# Patient Record
Sex: Male | Born: 1970 | Hispanic: No | Marital: Single | Smoking: Never smoker
Health system: Southern US, Community
[De-identification: ages and names within clinical notes are randomized; demographics above are authoritative.]

## PROBLEM LIST (undated history)

## (undated) DIAGNOSIS — I10 Essential (primary) hypertension: Secondary | ICD-10-CM

## (undated) DIAGNOSIS — F429 Obsessive-compulsive disorder, unspecified: Secondary | ICD-10-CM

---

## 1997-12-05 ENCOUNTER — Other Ambulatory Visit: Admission: RE | Admit: 1997-12-05 | Discharge: 1997-12-05 | Payer: Self-pay | Admitting: Family Medicine

## 2004-11-25 ENCOUNTER — Ambulatory Visit: Payer: Self-pay | Admitting: Internal Medicine

## 2004-11-30 ENCOUNTER — Ambulatory Visit: Payer: Self-pay | Admitting: Internal Medicine

## 2004-12-06 ENCOUNTER — Ambulatory Visit: Payer: Self-pay | Admitting: Internal Medicine

## 2015-12-06 ENCOUNTER — Emergency Department (HOSPITAL_COMMUNITY)
Admission: EM | Admit: 2015-12-06 | Discharge: 2015-12-06 | Disposition: A | Discharge status: Home / Self Care | Payer: Managed Care, Other (non HMO) | Attending: Emergency Medicine | Admitting: Emergency Medicine

## 2015-12-06 ENCOUNTER — Encounter (HOSPITAL_COMMUNITY): Payer: Self-pay | Admitting: Emergency Medicine

## 2015-12-06 ENCOUNTER — Emergency Department (HOSPITAL_COMMUNITY): Payer: Managed Care, Other (non HMO)

## 2015-12-06 DIAGNOSIS — R109 Unspecified abdominal pain: Secondary | ICD-10-CM | POA: Diagnosis present

## 2015-12-06 DIAGNOSIS — Z8659 Personal history of other mental and behavioral disorders: Secondary | ICD-10-CM | POA: Insufficient documentation

## 2015-12-06 DIAGNOSIS — N201 Calculus of ureter: Secondary | ICD-10-CM | POA: Insufficient documentation

## 2015-12-06 DIAGNOSIS — I10 Essential (primary) hypertension: Secondary | ICD-10-CM | POA: Diagnosis not present

## 2015-12-06 HISTORY — DX: Essential (primary) hypertension: I10

## 2015-12-06 HISTORY — DX: Obsessive-compulsive disorder, unspecified: F42.9

## 2015-12-06 LAB — COMPREHENSIVE METABOLIC PANEL
ALBUMIN: 3.8 g/dL (ref 3.5–5.0)
ALT: 34 U/L (ref 17–63)
AST: 34 U/L (ref 15–41)
Alkaline Phosphatase: 47 U/L (ref 38–126)
Anion gap: 9 (ref 5–15)
BILIRUBIN TOTAL: 0.6 mg/dL (ref 0.3–1.2)
BUN: 16 mg/dL (ref 6–20)
CHLORIDE: 103 mmol/L (ref 101–111)
CO2: 24 mmol/L (ref 22–32)
CREATININE: 1.86 mg/dL — AB (ref 0.61–1.24)
Calcium: 9.2 mg/dL (ref 8.9–10.3)
GFR calc Af Amer: 48 mL/min — ABNORMAL LOW (ref >60)
GFR, EST NON AFRICAN AMERICAN: 41 mL/min — AB (ref >60)
GLUCOSE: 97 mg/dL (ref 65–99)
Potassium: 4.4 mmol/L (ref 3.5–5.1)
Sodium: 136 mmol/L (ref 135–145)
Total Protein: 6.4 g/dL — ABNORMAL LOW (ref 6.5–8.1)

## 2015-12-06 LAB — CBC WITH DIFFERENTIAL/PLATELET
BASOS ABS: 0 10*3/uL (ref 0.0–0.1)
Basophils Relative: 0 %
EOS PCT: 0 %
Eosinophils Absolute: 0 10*3/uL (ref 0.0–0.7)
HEMATOCRIT: 49.4 % (ref 39.0–52.0)
Hemoglobin: 16.9 g/dL (ref 13.0–17.0)
LYMPHS PCT: 8 %
Lymphs Abs: 0.9 10*3/uL (ref 0.7–4.0)
MCH: 30.6 pg (ref 26.0–34.0)
MCHC: 34.2 g/dL (ref 30.0–36.0)
MCV: 89.3 fL (ref 78.0–100.0)
MONO ABS: 0.5 10*3/uL (ref 0.1–1.0)
MONOS PCT: 5 %
Neutro Abs: 8.7 10*3/uL — ABNORMAL HIGH (ref 1.7–7.7)
Neutrophils Relative %: 87 %
PLATELETS: 274 10*3/uL (ref 150–400)
RBC: 5.53 MIL/uL (ref 4.22–5.81)
RDW: 12.9 % (ref 11.5–15.5)
WBC: 10.1 10*3/uL (ref 4.0–10.5)

## 2015-12-06 LAB — URINE MICROSCOPIC-ADD ON

## 2015-12-06 LAB — URINALYSIS, ROUTINE W REFLEX MICROSCOPIC
Bilirubin Urine: NEGATIVE
GLUCOSE, UA: NEGATIVE mg/dL
Ketones, ur: NEGATIVE mg/dL
LEUKOCYTES UA: NEGATIVE
Nitrite: NEGATIVE
PH: 6.5 (ref 5.0–8.0)
PROTEIN: NEGATIVE mg/dL
Specific Gravity, Urine: 1.02 (ref 1.005–1.030)

## 2015-12-06 LAB — LIPASE, BLOOD: LIPASE: 33 U/L (ref 11–51)

## 2015-12-06 MED ORDER — OXYCODONE-ACETAMINOPHEN 5-325 MG PO TABS: 1.0000 | ORAL_TABLET | ORAL | Status: AC | PRN

## 2015-12-06 MED ORDER — OXYCODONE-ACETAMINOPHEN 5-325 MG PO TABS
1.0000 | ORAL_TABLET | ORAL | Status: DC | PRN
  Administered 2015-12-06: 1 via ORAL

## 2015-12-06 MED ORDER — OXYCODONE-ACETAMINOPHEN 5-325 MG PO TABS
ORAL_TABLET | ORAL | Status: AC
  Filled 2015-12-06: qty 1

## 2015-12-06 MED ORDER — TAMSULOSIN HCL 0.4 MG PO CAPS: 0.4000 mg | ORAL_CAPSULE | Freq: Every day | ORAL | Status: AC

## 2015-12-06 MED ORDER — SODIUM CHLORIDE 0.9 % IV BOLUS (SEPSIS): 1000.0000 mL | Freq: Once | INTRAVENOUS | Status: DC

## 2015-12-06 NOTE — ED Notes (Signed)
Pt. Transported to CT at this time.  

## 2015-12-06 NOTE — ED Provider Notes (Signed)
CSN: [id number]     Arrival date & time 12/06/15  1158 History  By signing my name below, I, Terry Diaz, attest that this documentation has been prepared under the direction and in the presence of [name], PA-C. Electronically Signed: Octavia HeirArianna Diaz, ED Scribe. 12/06/2015. 12:51 PM.    Chief Complaint  Patient presents with  . Flank Pain      The history is provided by the patient. No language interpreter was used.   HPI Comments: Terry Diaz is a 45 y.o. male who presents to the Emergency Department complaining of constant, gradual worsening, moderate, left flank pain that radiates to his LLQ and suprapubic region onset 4 hours ago. Pt reports going to the restroom a little less than normal. He notes he believes he might have a kidney stone due to having a strong family hx of kidney stones. Pt says riding in the car with the smallest bumps in the road caused him increased pain. He notes taking two tylenol to alleviate his pain with no relief. He denies dysuria, urinary frequency, vomiting, or hematuria.  Past Medical History  Diagnosis Date  . Hypertension   . OCD (obsessive compulsive disorder)    History reviewed. No pertinent past surgical history. No family history on file. Social History  Substance Use Topics  . Smoking status: Never Smoker   . Smokeless tobacco: None  . Alcohol Use: Yes     Comment: social    Review of Systems  A complete 10 system review of systems was obtained and all systems are negative except as noted in the HPI and PMH.    Allergies  Review of patient's allergies indicates no known allergies.  Home Medications   Prior to Admission medications   Not on File   Triage vitals: BP 124/76 mmHg  Pulse 63  Temp(Src) 98.4 F (36.9 C) (Oral)  Resp 18  SpO2 98% Physical Exam  Constitutional: He is oriented to person, place, and time. He appears well-developed and well-nourished.  HENT:  Head: Normocephalic.  Eyes: EOM are normal.   Neck: Normal range of motion.  Cardiovascular: Normal rate, regular rhythm and normal heart sounds.   Pulmonary/Chest: Effort normal and breath sounds normal.  Abdominal: Soft. He exhibits no distension. There is no tenderness. There is no rebound and no guarding.  Musculoskeletal: Normal range of motion.  Mild left CVA tenderness  Neurological: He is alert and oriented to person, place, and time.  Psychiatric: He has a normal mood and affect.  Nursing note and vitals reviewed.   ED Course  Procedures  DIAGNOSTIC STUDIES: Oxygen Saturation is 98% on RA, normal by my interpretation.  COORDINATION OF CARE: 12:50 PM Will order CT renal stones. Discussed treatment plan which includes urinalysis with pt at bedside and pt agreed to plan.  Labs Review Labs Reviewed  URINALYSIS, ROUTINE W REFLEX MICROSCOPIC (NOT AT [facility]) - Abnormal; Notable for the following:    Hgb urine dipstick TRACE (*)    All other components within normal limits  COMPREHENSIVE METABOLIC PANEL - Abnormal; Notable for the following:    Creatinine, Ser 1.86 (*)    Total Protein 6.4 (*)    GFR calc non Af Amer 41 (*)    GFR calc Af Amer 48 (*)    All other components within normal limits  CBC WITH DIFFERENTIAL/PLATELET - Abnormal; Notable for the following:    Neutro Abs 8.7 (*)    All other components within normal limits  URINE MICROSCOPIC-ADD ON -  Abnormal; Notable for the following:    Squamous Epithelial / LPF 0-5 (*)    Bacteria, UA RARE (*)    All other components within normal limits  LIPASE, BLOOD    Imaging Review Ct Renal Stone Study  12/06/2015  CLINICAL DATA:  Itchy left flank pain with nausea today. History of hypertension. EXAM: CT ABDOMEN AND PELVIS WITHOUT CONTRAST TECHNIQUE: Multidetector CT imaging of the abdomen and pelvis was performed following the standard protocol without IV contrast. COMPARISON:  None. FINDINGS: Lower chest: Clear lung bases. No significant pleural or pericardial  effusion. Hepatobiliary: The liver appears unremarkable as imaged in the noncontrast state. No evidence of gallstones, gallbladder wall thickening or biliary dilatation. Pancreas: Unremarkable. No pancreatic ductal dilatation or surrounding inflammatory changes. Spleen: Normal in size without focal abnormality. Adrenals/Urinary Tract: Both adrenal glands appear normal. No definite renal calculi. There is mild left-sided hydronephrosis and hydroureter without associated perinephric soft tissue stranding. There is a 1 mm calculus in the distal left ureter, best seen on axial image 69 and coronal image 48. The right ureter and bladder appear unremarkable. Stomach/Bowel: No evidence of bowel wall thickening, distention or surrounding inflammatory change. The appendix appears normal. Vascular/Lymphatic: There are no enlarged abdominal or pelvic lymph nodes. No significant vascular findings on noncontrast imaging. Reproductive: Unremarkable. Other: No evidence of abdominal wall mass or hernia. Musculoskeletal: No acute or significant osseous findings. IMPRESSION: 1. 1 mm partially obstructing distal left ureteral calculus. 2. No other urinary tract calculus. 3. No other acute or significant findings. Electronically Signed   By: [name] M.D.   On: 12/06/2015 13:29   I have personally reviewed and evaluated these images and lab results as part of my medical decision-making.   EKG Interpretation None      MDM   Final diagnoses:  Left ureteral stone    Pt presenting with new onset left flank pain beginning this morning. CT reveals small 1mm partially obstructing left ureteral stone. Labs reveal Cr of 1.86. Unsure of baseline. I spoke with Dr. [name] of urology, appreciate assistance. No intervention at this time but encourage plenty of PO hydration and f/u in clinic this week. On reassessment pt continues to have much improvement in pain. Tolerating PO. VSS and no e/o urosepsis. He does endorse taking  NSAIDS very regularly and states that he remembers his PCP telling him that his kidney function was mildly elevated last time he had screening labs done. I do suspect he has some underlying renal insufficiency. Rx given for pain meds and flomax. Instructed close urology f/u. Reminded pt to have PCP re-check kidney function at his next appointment. ER return precautions given.    I personally performed the services described in this documentation, which was scribed in my presence. The recorded information has been reviewed and is accurate.   Carlene Coria, PA-C 12/06/15 1517  Pricilla Loveless, MD 12/06/15 [phone]

## 2015-12-06 NOTE — ED Notes (Signed)
Pt reports left flank pain starting this morning at 800. Pt alert x4.

## 2015-12-06 NOTE — Discharge Instructions (Signed)
Kidney Stones °Kidney stones (urolithiasis) are deposits that form inside your kidneys. The intense pain is caused by the stone moving through the urinary tract. When the stone moves, the ureter goes into spasm around the stone. The stone is usually passed in the urine.  °CAUSES  °· A disorder that makes certain neck glands produce too much parathyroid hormone (primary hyperparathyroidism). °· A buildup of uric acid crystals, similar to gout in your joints. °· Narrowing (stricture) of the ureter. °· A kidney obstruction present at birth (congenital obstruction). °· Previous surgery on the kidney or ureters. °· Numerous kidney infections. °SYMPTOMS  °· Feeling sick to your stomach (nauseous). °· Throwing up (vomiting). °· Blood in the urine (hematuria). °· Pain that usually spreads (radiates) to the groin. °· Frequency or urgency of urination. °DIAGNOSIS  °· Taking a history and physical exam. °· Blood or urine tests. °· CT scan. °· Occasionally, an examination of the inside of the urinary bladder (cystoscopy) is performed. °TREATMENT  °· Observation. °· Increasing your fluid intake. °· Extracorporeal shock wave lithotripsy--This is a noninvasive procedure that uses shock waves to break up kidney stones. °· Surgery may be needed if you have severe pain or persistent obstruction. There are various surgical procedures. Most of the procedures are performed with the use of small instruments. Only small incisions are needed to accommodate these instruments, so recovery time is minimized. °The size, location, and chemical composition are all important variables that will determine the proper choice of action for you. Talk to your health care provider to better understand your situation so that you will minimize the risk of injury to yourself and your kidney.  °HOME CARE INSTRUCTIONS  °· Drink enough water and fluids to keep your urine clear or pale yellow. This will help you to pass the stone or stone fragments. °· Strain  all urine through the provided strainer. Keep all particulate matter and stones for your health care provider to see. The stone causing the pain may be as small as a grain of salt. It is very important to use the strainer each and every time you pass your urine. The collection of your stone will allow your health care provider to analyze it and verify that a stone has actually passed. The stone analysis will often identify what you can do to reduce the incidence of recurrences. °· Only take over-the-counter or prescription medicines for pain, discomfort, or fever as directed by your health care provider. °· Keep all follow-up visits as told by your health care provider. This is important. °· Get follow-up X-rays if required. The absence of pain does not always mean that the stone has passed. It may have only stopped moving. If the urine remains completely obstructed, it can cause loss of kidney function or even complete destruction of the kidney. It is your responsibility to make sure X-rays and follow-ups are completed. Ultrasounds of the kidney can show blockages and the status of the kidney. Ultrasounds are not associated with any radiation and can be performed easily in a matter of minutes. °· Make changes to your daily diet as told by your health care provider. You may be told to: °¨ Limit the amount of salt that you eat. °¨ Eat 5 or more servings of fruits and vegetables each day. °¨ Limit the amount of meat, poultry, fish, and eggs that you eat. °· Collect a 24-hour urine sample as told by your health care provider. You may need to collect another urine sample every 6-12   months. °SEEK MEDICAL CARE IF: °· You experience pain that is progressive and unresponsive to any pain medicine you have been prescribed. °SEEK IMMEDIATE MEDICAL CARE IF:  °· Pain cannot be controlled with the prescribed medicine. °· You have a fever or shaking chills. °· The severity or intensity of pain increases over 18 hours and is not  relieved by pain medicine. °· You develop a new onset of abdominal pain. °· You feel faint or pass out. °· You are unable to urinate. °  °This information is not intended to replace advice given to you by your health care provider. Make sure you discuss any questions you have with your health care provider. °  °Document Released: 07/03/2005 Document Revised: 03/24/2015 Document Reviewed: 12/04/2012 °Elsevier Interactive Patient Education ©2016 [employer] ° °

## 2017-12-22 IMAGING — CT CT RENAL STONE PROTOCOL
2 of 4 series · 17 of 46 positions shown, 19 images · non-contrast
Comparison: None.

CLINICAL DATA: Itchy left flank pain with nausea today. History of
hypertension.

EXAM:
CT ABDOMEN AND PELVIS WITHOUT CONTRAST
TECHNIQUE: Multidetector CT imaging of the abdomen and pelvis was performed
following the standard protocol without IV contrast.

[Series 2: renal stone 5mm · axial · 0.71mm/px · z∈[+774,+1159]mm · 14 of 85 slices shown, 16 images]
[im 4/85  soft-tissue]
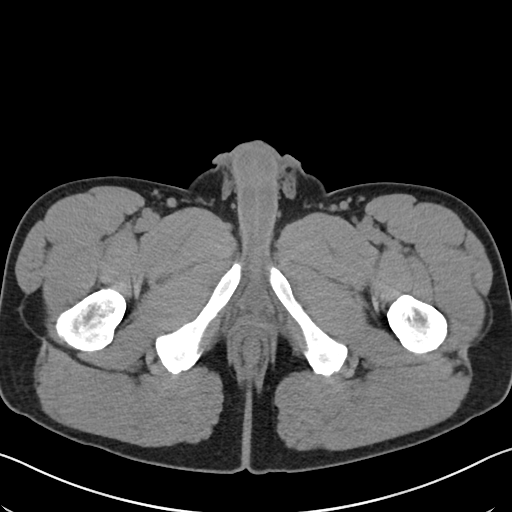
[im 4/85  bone]
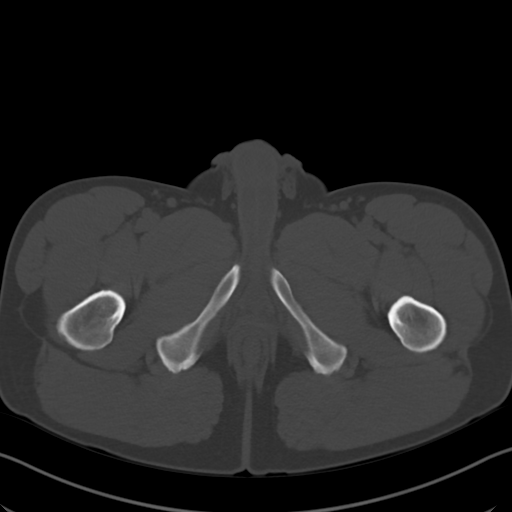
[im 11/85  soft-tissue]
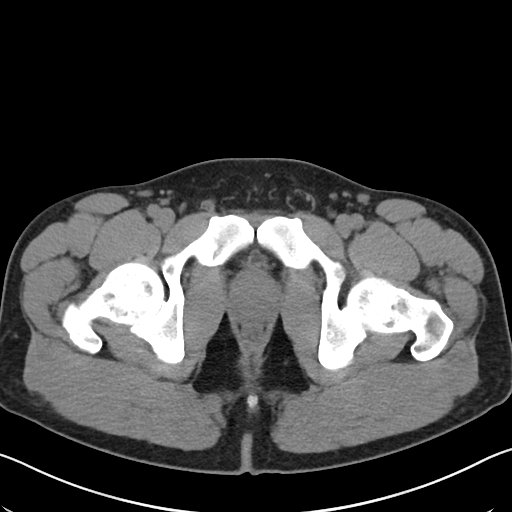
[im 15/85  soft-tissue]
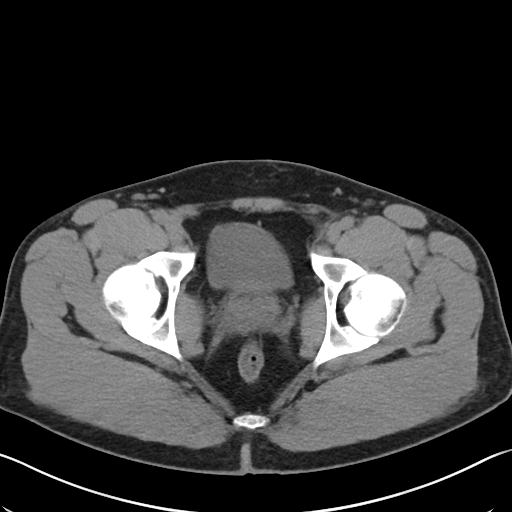
[im 22/85  soft-tissue]
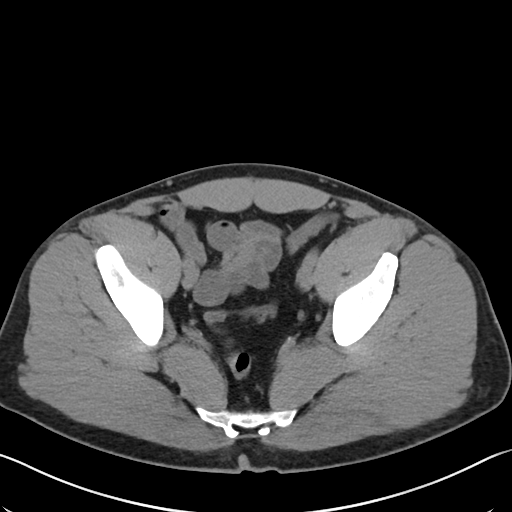
[im 30/85  soft-tissue]
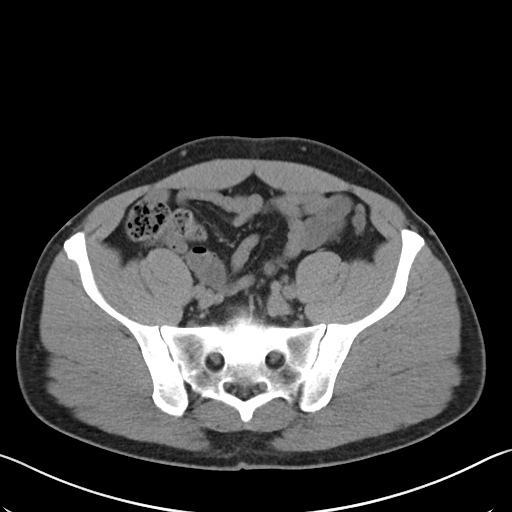
[im 33/85  soft-tissue]
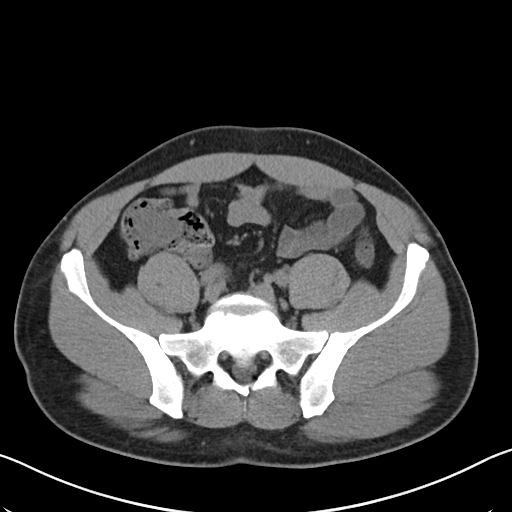
[im 41/85  soft-tissue]
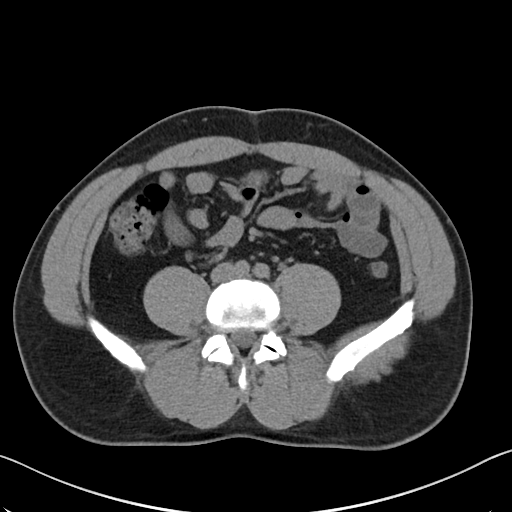
[im 44/85  soft-tissue]
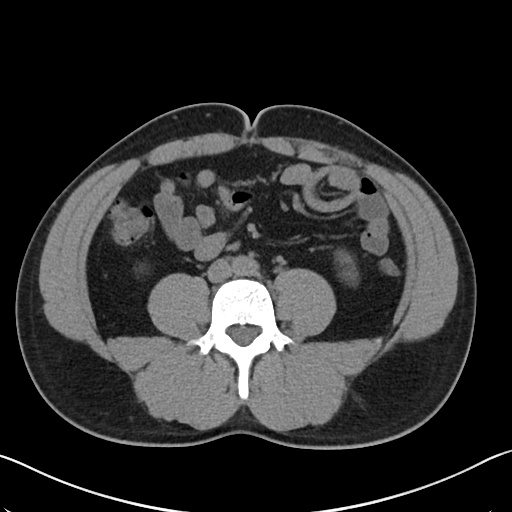
[im 52/85  soft-tissue]
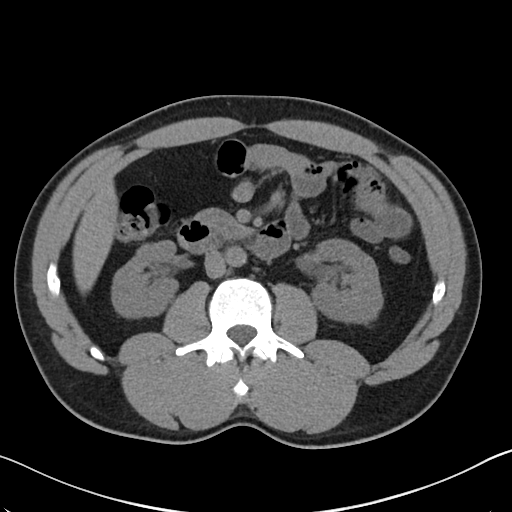
[im 52/85  bone]
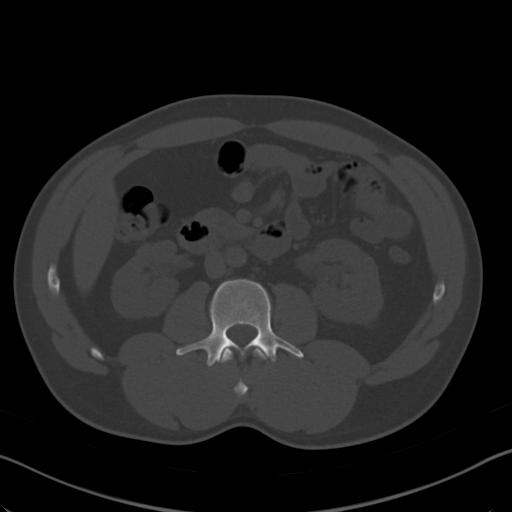
[im 55/85  soft-tissue]
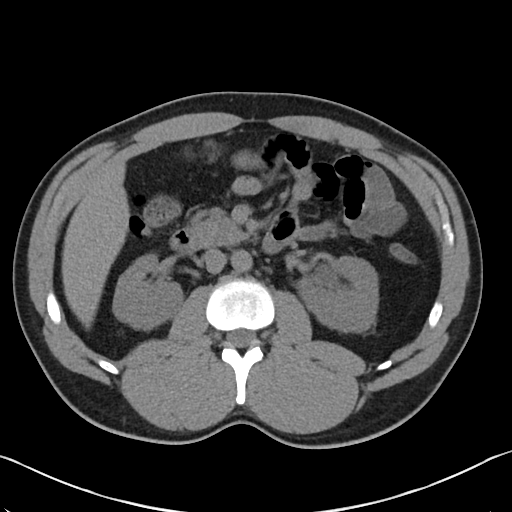
[im 63/85  soft-tissue]
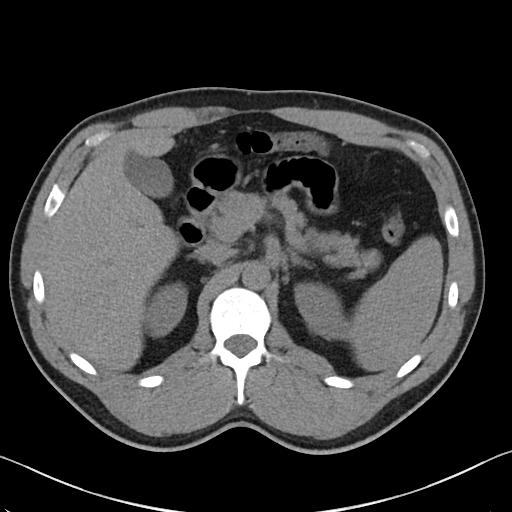
[im 70/85  soft-tissue]
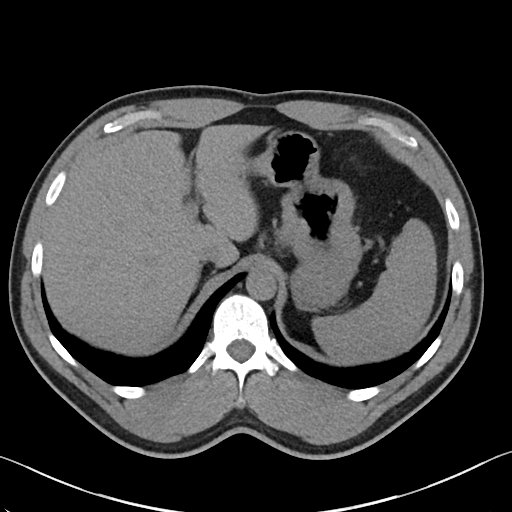
[im 74/85  soft-tissue]
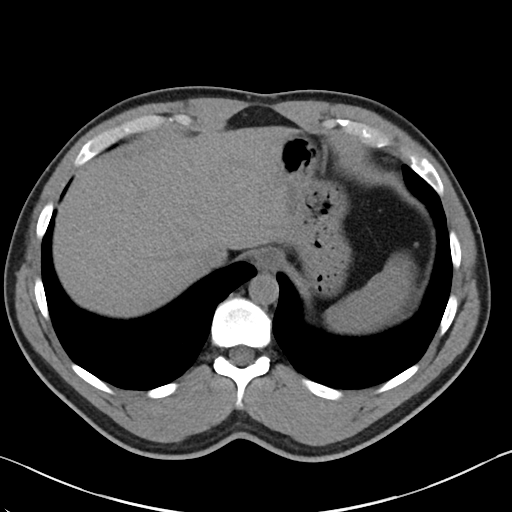
[im 81/85  soft-tissue]
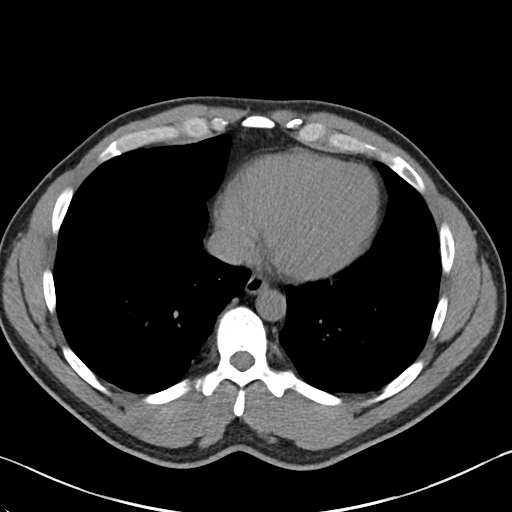

[Series 4: renal stone 3.0 cor · coronal · 0.80mm/px · 3 of 78 slices shown]
[im 26/78  soft-tissue]
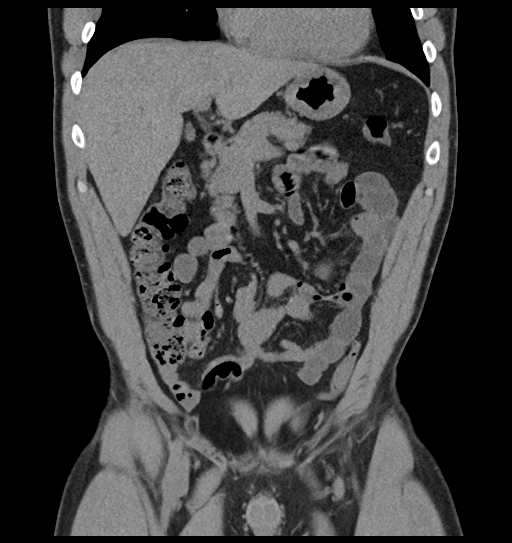
[im 35/78  soft-tissue]
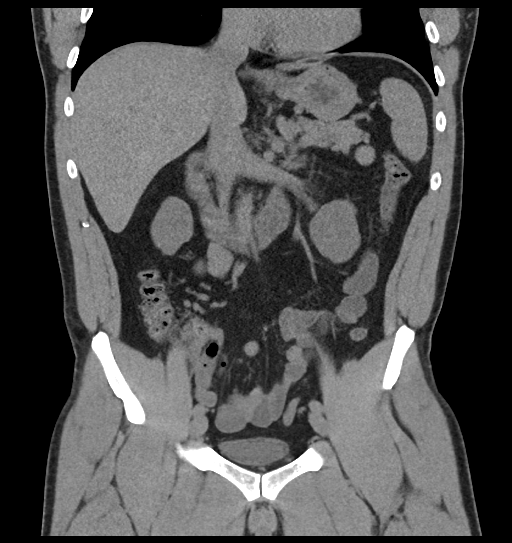
[im 43/78  soft-tissue]
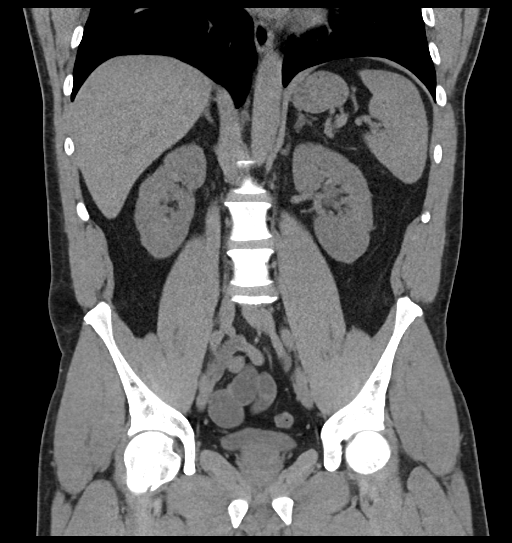

[17 of 46 positions shown; findings below may reference images not displayed]

FINDINGS: Lower chest: Clear lung bases. No significant pleural or pericardial
effusion.

Hepatobiliary: The liver appears unremarkable as imaged in the
noncontrast state. No evidence of gallstones, gallbladder wall
thickening or biliary dilatation.

Pancreas: Unremarkable. No pancreatic ductal dilatation or
surrounding inflammatory changes.

Spleen: Normal in size without focal abnormality.

Adrenals/Urinary Tract: Both adrenal glands appear normal. No
definite renal calculi. There is mild left-sided hydronephrosis and
hydroureter without associated perinephric soft tissue stranding.
There is a 1 mm calculus in the distal left ureter, best seen on
axial image 69 and coronal image 48. The right ureter and bladder
appear unremarkable.

Stomach/Bowel: No evidence of bowel wall thickening, distention or
surrounding inflammatory change. The appendix appears normal.

Vascular/Lymphatic: There are no enlarged abdominal or pelvic lymph
nodes. No significant vascular findings on noncontrast imaging.

Reproductive: Unremarkable.

Other: No evidence of abdominal wall mass or hernia.

Musculoskeletal: No acute or significant osseous findings.
IMPRESSION: 1. 1 mm partially obstructing distal left ureteral calculus.
2. No other urinary tract calculus.
3. No other acute or significant findings.
